# Patient Record
Sex: Male | Born: 1985 | Race: White | Hispanic: No | Marital: Single
Health system: Southern US, Community
[De-identification: ages and names within clinical notes are randomized; demographics above are authoritative.]

---

## 2016-08-22 ENCOUNTER — Emergency Department: Payer: Self-pay

## 2016-08-22 ENCOUNTER — Encounter: Payer: Self-pay | Admitting: Emergency Medicine

## 2016-08-22 ENCOUNTER — Emergency Department
Admission: EM | Admit: 2016-08-22 | Discharge: 2016-08-22 | Disposition: A | Discharge status: Home / Self Care | Payer: Self-pay | Attending: Emergency Medicine | Admitting: Emergency Medicine

## 2016-08-22 DIAGNOSIS — Y939 Activity, unspecified: Secondary | ICD-10-CM | POA: Insufficient documentation

## 2016-08-22 DIAGNOSIS — M79632 Pain in left forearm: Secondary | ICD-10-CM | POA: Insufficient documentation

## 2016-08-22 DIAGNOSIS — S52122A Displaced fracture of head of left radius, initial encounter for closed fracture: Secondary | ICD-10-CM | POA: Insufficient documentation

## 2016-08-22 DIAGNOSIS — Y999 Unspecified external cause status: Secondary | ICD-10-CM | POA: Insufficient documentation

## 2016-08-22 DIAGNOSIS — S42402A Unspecified fracture of lower end of left humerus, initial encounter for closed fracture: Secondary | ICD-10-CM

## 2016-08-22 DIAGNOSIS — Y929 Unspecified place or not applicable: Secondary | ICD-10-CM | POA: Insufficient documentation

## 2016-08-22 DIAGNOSIS — W010XXA Fall on same level from slipping, tripping and stumbling without subsequent striking against object, initial encounter: Secondary | ICD-10-CM | POA: Insufficient documentation

## 2016-08-22 MED ORDER — TRAMADOL HCL 50 MG PO TABS: 50.0000 mg | ORAL_TABLET | Freq: Four times a day (QID) | ORAL | 0 refills | Status: AC | PRN

## 2016-08-22 MED ORDER — IBUPROFEN 800 MG PO TABS
800.0000 mg | ORAL_TABLET | Freq: Once | ORAL | Status: AC
  Administered 2016-08-22: 800 mg via ORAL
  Filled 2016-08-22: qty 1

## 2016-08-22 MED ORDER — IBUPROFEN 600 MG PO TABS: 600.0000 mg | ORAL_TABLET | Freq: Four times a day (QID) | ORAL | 0 refills | Status: AC | PRN

## 2016-08-22 NOTE — Discharge Instructions (Signed)
Wear splint and sling until evaluation by orthopedic Dr. °

## 2016-08-22 NOTE — ED Triage Notes (Signed)
Pt reports tripped and fell on curb today. Reports left wrist injury.

## 2016-08-22 NOTE — ED Provider Notes (Signed)
[facility] Emergency Department Provider Note   ____________________________________________   First MD Initiated Contact with Patient 08/22/16 1643     (approximate)  I have reviewed the triage vital signs and the nursing notes.   HISTORY  Chief Complaint Wrist Injury    HPI Forrestine HimWilliam Webster is a 31 y.o. male patient complain of left forearm and left wrist pain secondary to a trip and fall today. Patient stated pain increases with pronation supination of the forearm and with flexion extension of the wrist. Patient is left-hand dominant. No palliative measures for this complaint. Patient rates his pain as a 9/10.Patient described a pain as "achy". Patient denies loss of sensation.   History reviewed. No pertinent past medical history.  There are no active problems to display for this patient.   No past surgical history on file.  Prior to Admission medications   Medication Sig Start Date End Date Taking? Authorizing Provider  ibuprofen (ADVIL,MOTRIN) 600 MG tablet Take 1 tablet (600 mg total) by mouth every 6 (six) hours as needed. 08/22/16   Joni Reiningonald K Dorn Hartshorne, PA-C  traMADol (ULTRAM) 50 MG tablet Take 1 tablet (50 mg total) by mouth every 6 (six) hours as needed. 08/22/16 08/22/17  Joni Reiningonald K Theseus Birnie, PA-C    Allergies Patient has no known allergies.  No family history on file.  Social History Social History  Substance Use Topics  . Smoking status: Not on file  . Smokeless tobacco: Not on file  . Alcohol use Not on file    Review of Systems Constitutional: No fever/chills Eyes: No visual changes. ENT: No sore throat. Cardiovascular: Denies chest pain. Respiratory: Denies shortness of breath. Gastrointestinal: No abdominal pain.  No nausea, no vomiting.  No diarrhea.  No constipation. Genitourinary: Negative for dysuria. Musculoskeletal: Negative for back pain. Skin: Negative for rash. Neurological: Negative for headaches, focal weakness or  numbness.    ____________________________________________   PHYSICAL EXAM:  VITAL SIGNS: ED Triage Vitals  Enc Vitals Group     BP 08/22/16 1605 133/77     Pulse Rate 08/22/16 1605 87     Resp 08/22/16 1605 18     Temp 08/22/16 1605 98.5 F (36.9 C)     Temp Source 08/22/16 1605 Oral     SpO2 08/22/16 1605 96 %     Weight 08/22/16 1606 192 lb (87.1 kg)     Height 08/22/16 1606 6' (1.829 m)     Head Circumference --      Peak Flow --      Pain Score 08/22/16 1606 9     Pain Loc --      Pain Edu? --      Excl. in GC? --     Constitutional: Alert and oriented. Well appearing and in no acute distress. Eyes: Conjunctivae are normal. PERRL. EOMI. Head: Atraumatic. Nose: No congestion/rhinnorhea. Mouth/Throat: Mucous membranes are moist.  Oropharynx non-erythematous. Neck: No stridor.  No cervical spine tenderness to palpation. Hematological/Lymphatic/Immunilogical: No cervical lymphadenopathy. Cardiovascular: Normal rate, regular rhythm. Grossly normal heart sounds.  Good peripheral circulation. Respiratory: Normal respiratory effort.  No retractions. Lungs CTAB. Gastrointestinal: Soft and nontender. No distention. No abdominal bruits. No CVA tenderness. Musculoskeletal: No obvious deformity to the left forearm or wrist. Patient is tender palpation at the proximal and distal radius. Patient decreased range of motion secondary to complain of pain.  Neurologic:  Normal speech and language. No gross focal neurologic deficits are appreciated. No gait instability. Skin:  Skin is warm,  dry and intact. No rash noted. Psychiatric: Mood and affect are normal. Speech and behavior are normal.  ____________________________________________   LABS (all labs ordered are listed, but only abnormal results are displayed)  Labs Reviewed - No data to display ____________________________________________  EKG   ____________________________________________  RADIOLOGY  X-ray consistent  for minimal displaced fracture of the radial volar aspect of the radial head. ____________________________________________   PROCEDURES  Procedure(s) performed: None  Procedures  Critical Care performed: No  ____________________________________________   INITIAL IMPRESSION / ASSESSMENT AND PLAN / ED COURSE  Pertinent labs & imaging results that were available during my care of the patient were reviewed by me and considered in my medical decision making (see chart for details).  Left elbow fracture. Discussed x-ray finding with patient. Patient advised follow orthopedics. Patient given a prescription for tramadol and ibuprofen.      ____________________________________________   FINAL CLINICAL IMPRESSION(S) / ED DIAGNOSES Left radial head fracture. Patient placed in splint and sling. Final diagnoses:  Elbow fracture, left, closed, initial encounter      NEW MEDICATIONS STARTED DURING THIS VISIT:  New Prescriptions   IBUPROFEN (ADVIL,MOTRIN) 600 MG TABLET    Take 1 tablet (600 mg total) by mouth every 6 (six) hours as needed.   TRAMADOL (ULTRAM) 50 MG TABLET    Take 1 tablet (50 mg total) by mouth every 6 (six) hours as needed.     Note:  This document was prepared using Dragon voice recognition software and may include unintentional dictation errors.    Joni Reining, PA-C 08/22/16 1841    Arnaldo Natal, MD 08/25/16 [phone]

## 2018-03-21 IMAGING — DX DG ELBOW COMPLETE 3+V*L*
4 series · 4 of 4 positions shown · non-contrast
Comparison: None.

CLINICAL DATA: Tripped and fell on curb, with left arm pain.
Apparent elbow joint effusion on forearm radiographs. Further
evaluation requested.

EXAM:
LEFT ELBOW - COMPLETE 3+ VIEW

[elbow ap]
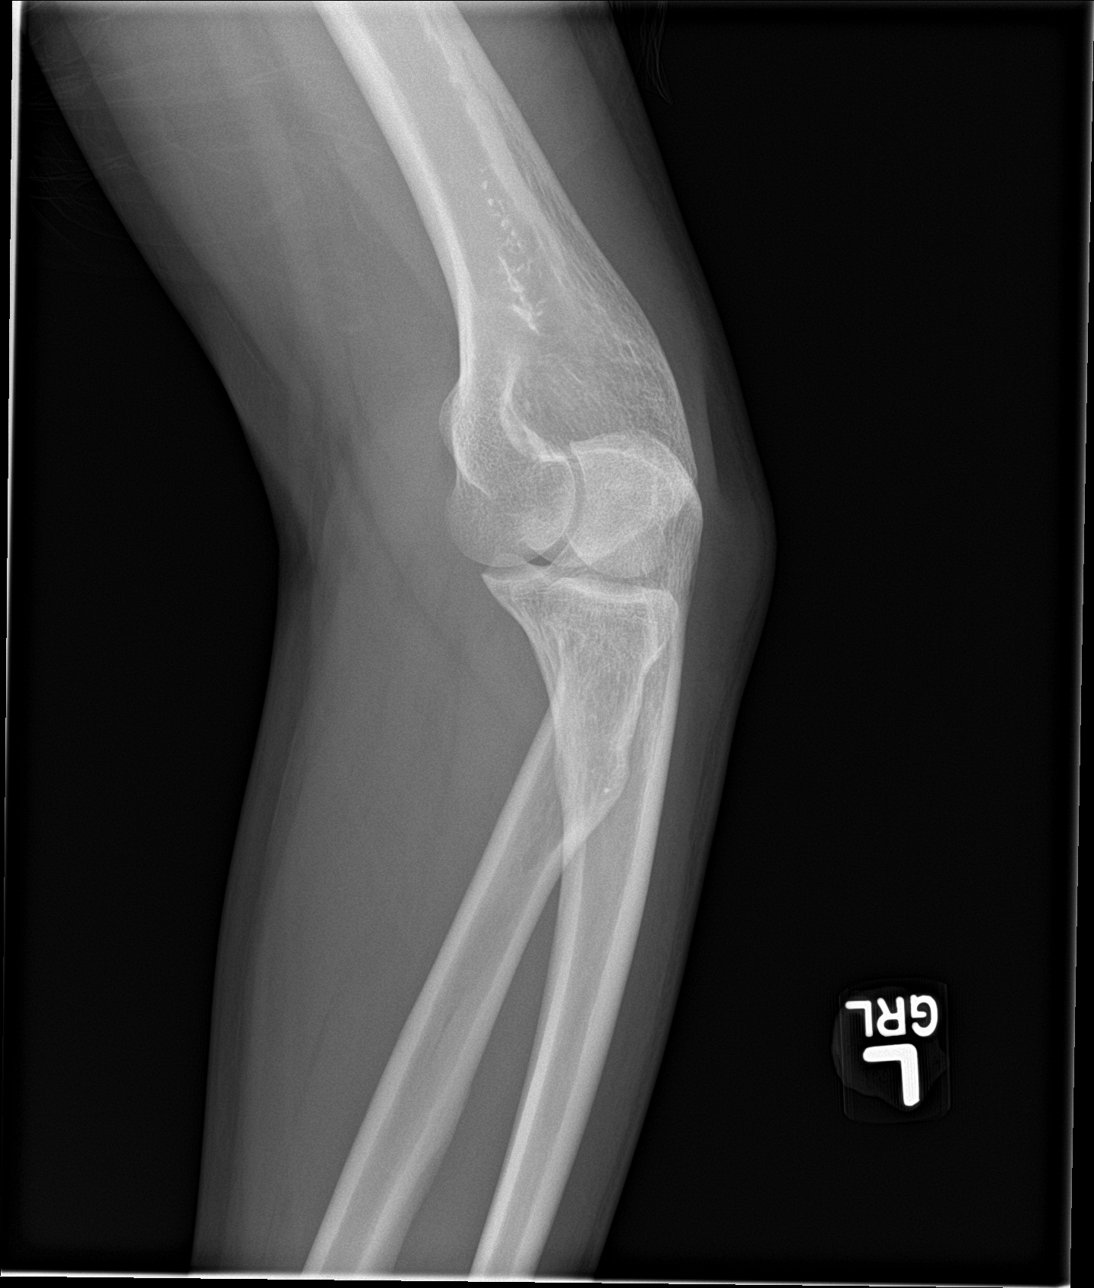

[elbow obl (1 of 2)]
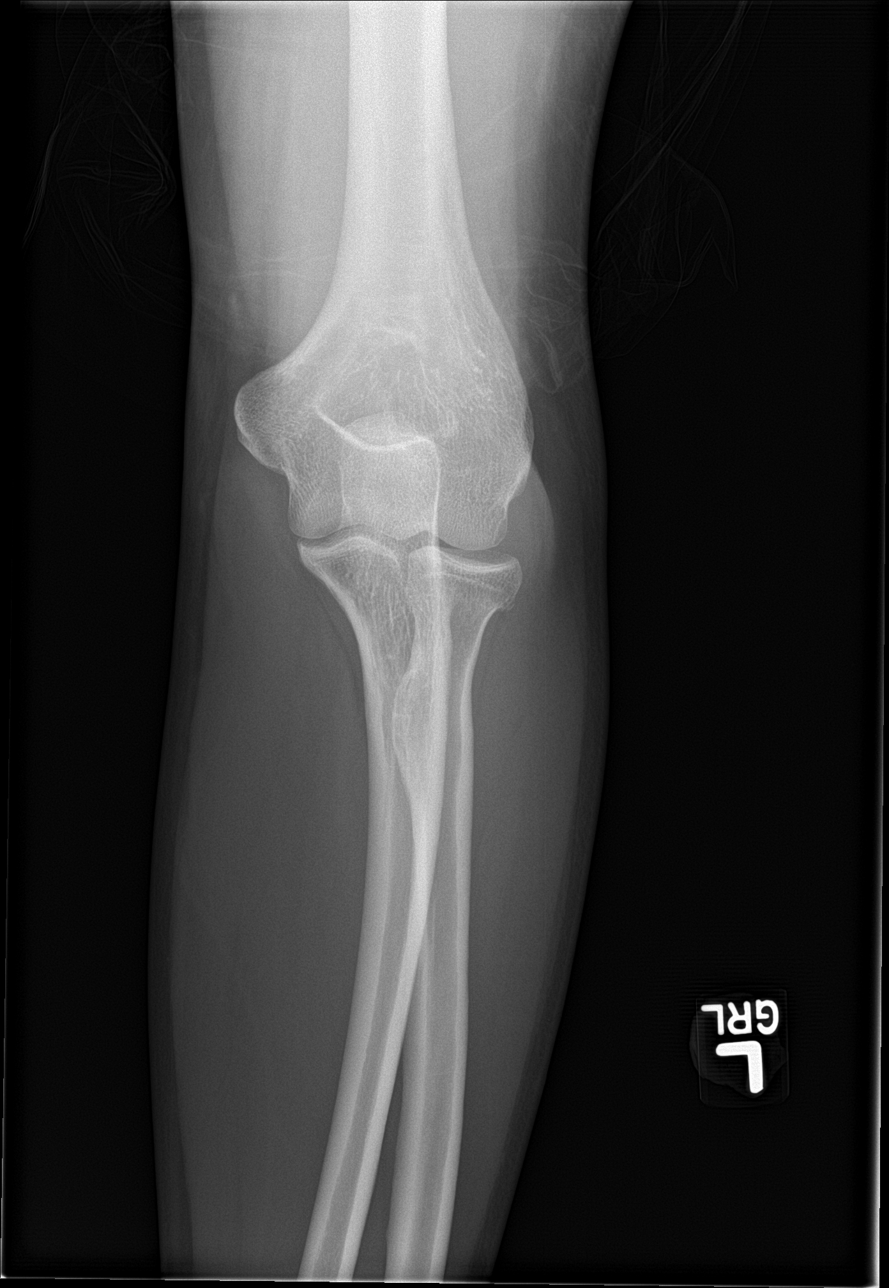

[elbow obl (2 of 2)]
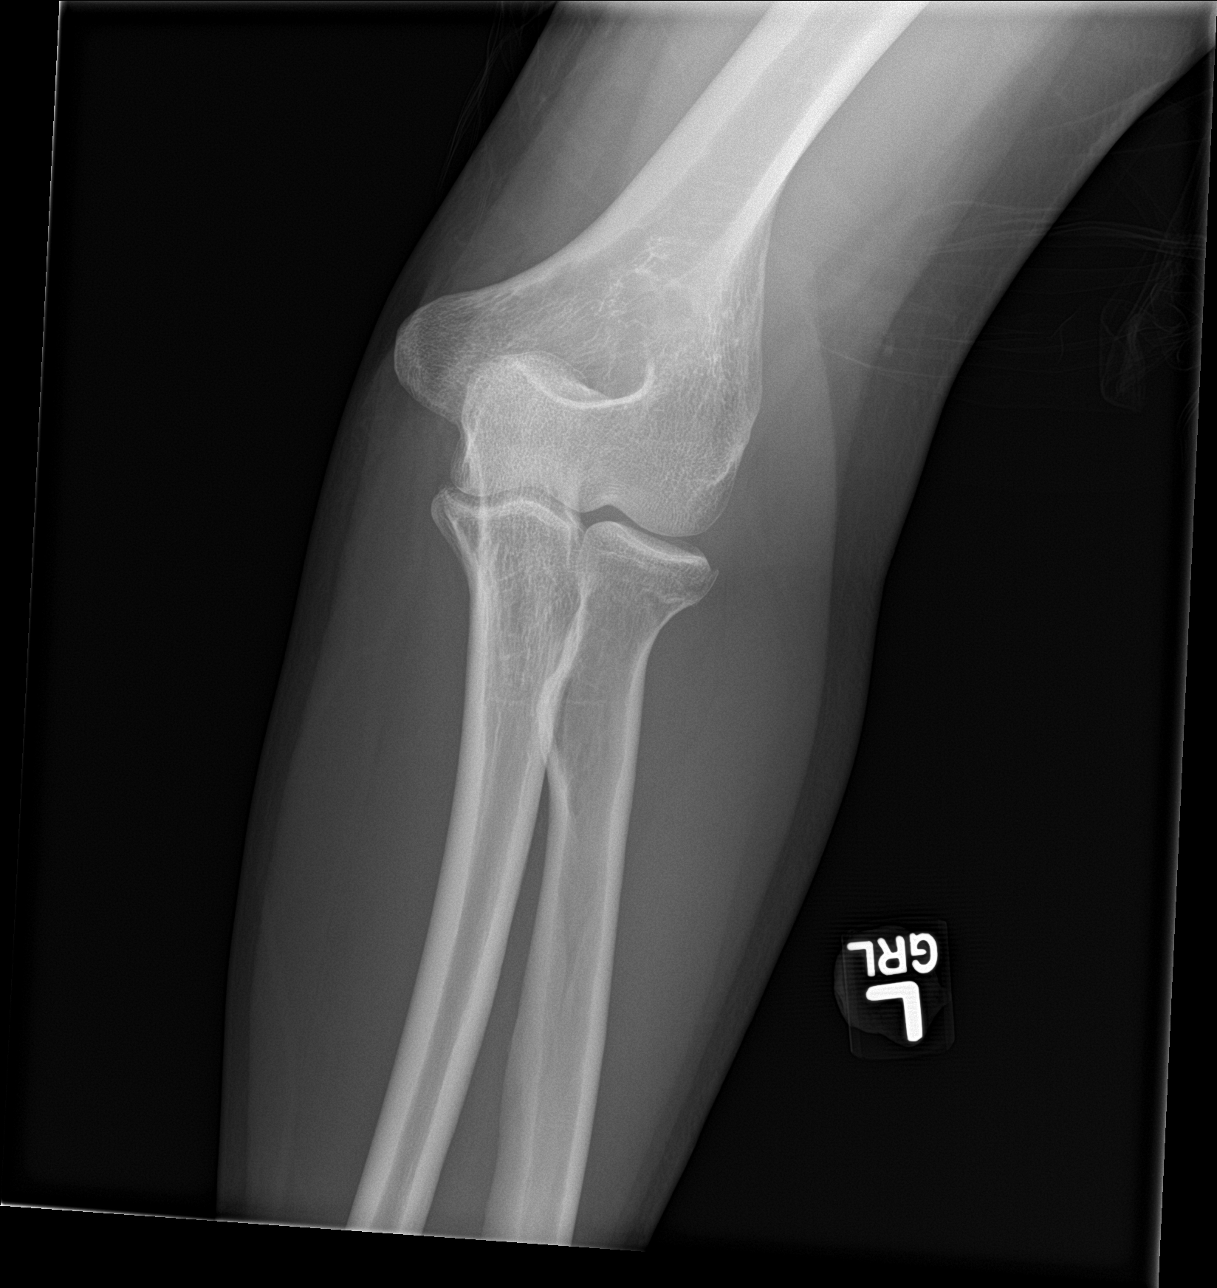

[elbow lat]
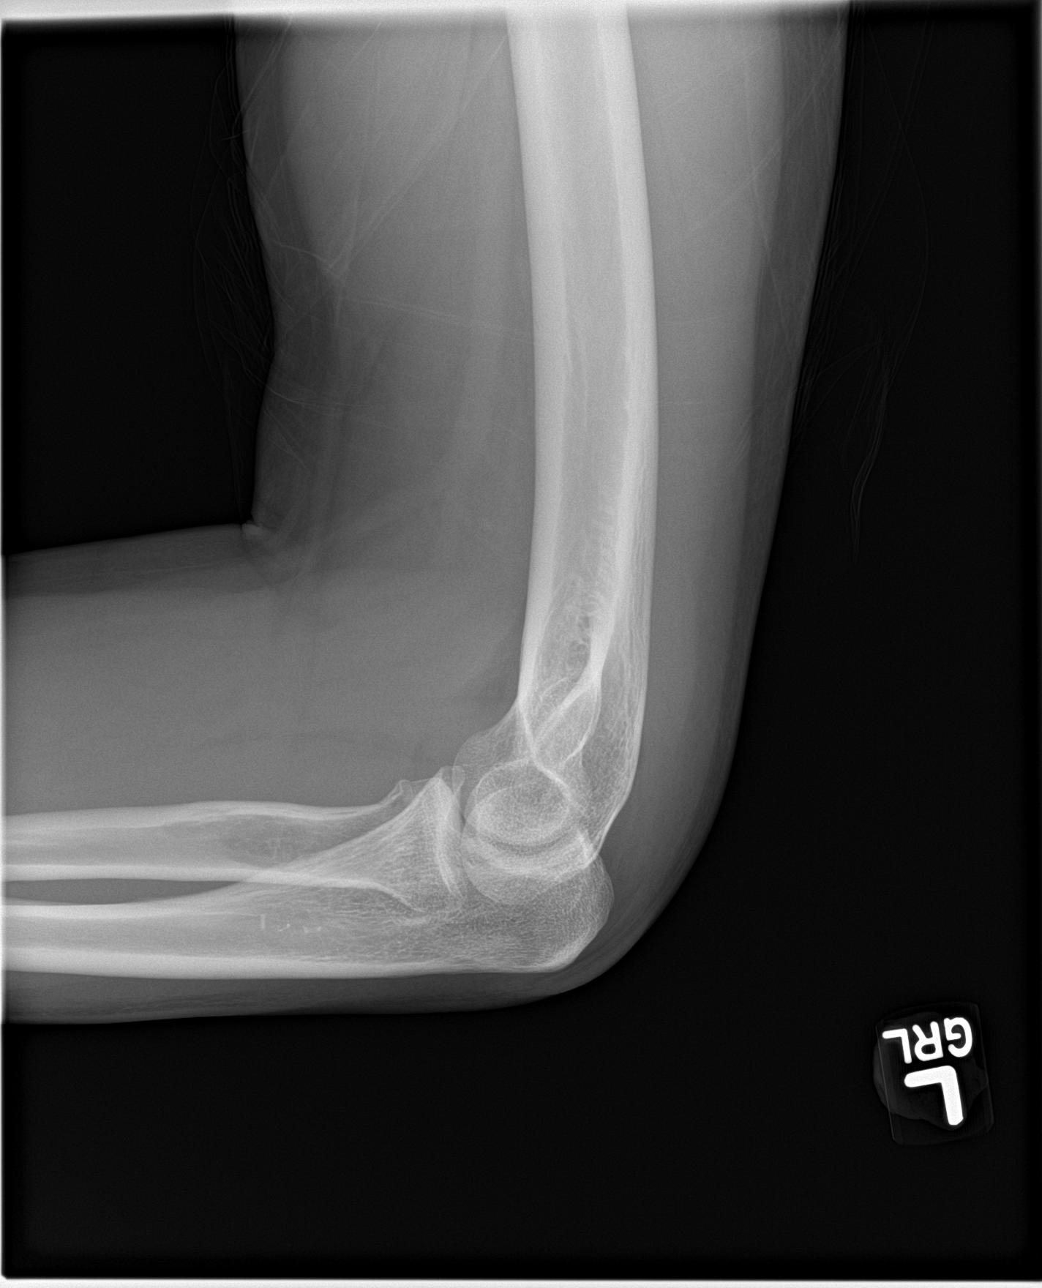

[4 of 4 positions shown; findings below may reference images not displayed]

FINDINGS: There appears to be a minimally displaced fracture at the radial
volar aspect of the radial head. An associated prominent elbow joint
effusion is seen. No additional fractures are identified.

No additional soft tissue abnormalities are characterized on
radiograph.
IMPRESSION: Minimally displaced fracture at the radial volar aspect of the
radial head, with associated elbow joint effusion.
